# Patient Record
Sex: Female | Born: 1935 | Race: White | Hispanic: No | Marital: Married | Smoking: Never smoker
Health system: Southern US, Community
[De-identification: ages and names within clinical notes are randomized; demographics above are authoritative.]

---

## 2011-04-04 DIAGNOSIS — H10029 Other mucopurulent conjunctivitis, unspecified eye: Secondary | ICD-10-CM | POA: Diagnosis not present

## 2011-06-06 DIAGNOSIS — R002 Palpitations: Secondary | ICD-10-CM | POA: Diagnosis not present

## 2011-06-06 DIAGNOSIS — L820 Inflamed seborrheic keratosis: Secondary | ICD-10-CM | POA: Diagnosis not present

## 2011-06-06 DIAGNOSIS — E119 Type 2 diabetes mellitus without complications: Secondary | ICD-10-CM | POA: Diagnosis not present

## 2011-06-06 DIAGNOSIS — E0590 Thyrotoxicosis, unspecified without thyrotoxic crisis or storm: Secondary | ICD-10-CM | POA: Diagnosis not present

## 2011-06-06 DIAGNOSIS — E7800 Pure hypercholesterolemia, unspecified: Secondary | ICD-10-CM | POA: Diagnosis not present

## 2011-06-10 DIAGNOSIS — R002 Palpitations: Secondary | ICD-10-CM | POA: Diagnosis not present

## 2011-06-10 DIAGNOSIS — E119 Type 2 diabetes mellitus without complications: Secondary | ICD-10-CM | POA: Diagnosis not present

## 2011-06-10 DIAGNOSIS — D509 Iron deficiency anemia, unspecified: Secondary | ICD-10-CM | POA: Diagnosis not present

## 2011-06-10 DIAGNOSIS — E7800 Pure hypercholesterolemia, unspecified: Secondary | ICD-10-CM | POA: Diagnosis not present

## 2011-10-09 DIAGNOSIS — Z124 Encounter for screening for malignant neoplasm of cervix: Secondary | ICD-10-CM | POA: Diagnosis not present

## 2011-10-09 DIAGNOSIS — M949 Disorder of cartilage, unspecified: Secondary | ICD-10-CM | POA: Diagnosis not present

## 2011-10-09 DIAGNOSIS — I359 Nonrheumatic aortic valve disorder, unspecified: Secondary | ICD-10-CM | POA: Diagnosis not present

## 2011-10-09 DIAGNOSIS — I10 Essential (primary) hypertension: Secondary | ICD-10-CM | POA: Diagnosis not present

## 2011-10-09 DIAGNOSIS — D508 Other iron deficiency anemias: Secondary | ICD-10-CM | POA: Diagnosis not present

## 2011-10-09 DIAGNOSIS — M899 Disorder of bone, unspecified: Secondary | ICD-10-CM | POA: Diagnosis not present

## 2011-10-09 DIAGNOSIS — E119 Type 2 diabetes mellitus without complications: Secondary | ICD-10-CM | POA: Diagnosis not present

## 2011-10-09 DIAGNOSIS — E7800 Pure hypercholesterolemia, unspecified: Secondary | ICD-10-CM | POA: Diagnosis not present

## 2011-11-05 DIAGNOSIS — Z13820 Encounter for screening for osteoporosis: Secondary | ICD-10-CM | POA: Diagnosis not present

## 2011-11-05 DIAGNOSIS — Z1231 Encounter for screening mammogram for malignant neoplasm of breast: Secondary | ICD-10-CM | POA: Diagnosis not present

## 2011-11-05 DIAGNOSIS — M899 Disorder of bone, unspecified: Secondary | ICD-10-CM | POA: Diagnosis not present

## 2011-12-16 DIAGNOSIS — Z23 Encounter for immunization: Secondary | ICD-10-CM | POA: Diagnosis not present

## 2011-12-19 DIAGNOSIS — I10 Essential (primary) hypertension: Secondary | ICD-10-CM | POA: Diagnosis not present

## 2011-12-19 DIAGNOSIS — I2510 Atherosclerotic heart disease of native coronary artery without angina pectoris: Secondary | ICD-10-CM | POA: Diagnosis not present

## 2011-12-19 DIAGNOSIS — R609 Edema, unspecified: Secondary | ICD-10-CM | POA: Diagnosis not present

## 2011-12-25 DIAGNOSIS — R609 Edema, unspecified: Secondary | ICD-10-CM | POA: Diagnosis not present

## 2012-01-08 DIAGNOSIS — R011 Cardiac murmur, unspecified: Secondary | ICD-10-CM | POA: Diagnosis not present

## 2012-01-08 DIAGNOSIS — I2510 Atherosclerotic heart disease of native coronary artery without angina pectoris: Secondary | ICD-10-CM | POA: Diagnosis not present

## 2012-01-22 DIAGNOSIS — L570 Actinic keratosis: Secondary | ICD-10-CM | POA: Diagnosis not present

## 2012-01-22 DIAGNOSIS — L723 Sebaceous cyst: Secondary | ICD-10-CM | POA: Diagnosis not present

## 2012-01-27 DIAGNOSIS — Z961 Presence of intraocular lens: Secondary | ICD-10-CM | POA: Diagnosis not present

## 2012-02-24 DIAGNOSIS — I10 Essential (primary) hypertension: Secondary | ICD-10-CM | POA: Diagnosis not present

## 2012-02-24 DIAGNOSIS — N3941 Urge incontinence: Secondary | ICD-10-CM | POA: Diagnosis not present

## 2012-02-24 DIAGNOSIS — J309 Allergic rhinitis, unspecified: Secondary | ICD-10-CM | POA: Diagnosis not present

## 2012-02-24 DIAGNOSIS — M949 Disorder of cartilage, unspecified: Secondary | ICD-10-CM | POA: Diagnosis not present

## 2012-02-24 DIAGNOSIS — Z87440 Personal history of urinary (tract) infections: Secondary | ICD-10-CM | POA: Diagnosis not present

## 2012-02-24 DIAGNOSIS — I359 Nonrheumatic aortic valve disorder, unspecified: Secondary | ICD-10-CM | POA: Diagnosis not present

## 2012-02-24 DIAGNOSIS — M899 Disorder of bone, unspecified: Secondary | ICD-10-CM | POA: Diagnosis not present

## 2012-02-24 DIAGNOSIS — G4700 Insomnia, unspecified: Secondary | ICD-10-CM | POA: Diagnosis not present

## 2012-02-24 DIAGNOSIS — L259 Unspecified contact dermatitis, unspecified cause: Secondary | ICD-10-CM | POA: Diagnosis not present

## 2012-02-24 DIAGNOSIS — R82998 Other abnormal findings in urine: Secondary | ICD-10-CM | POA: Diagnosis not present

## 2012-02-24 DIAGNOSIS — E7800 Pure hypercholesterolemia, unspecified: Secondary | ICD-10-CM | POA: Diagnosis not present

## 2012-02-24 DIAGNOSIS — E119 Type 2 diabetes mellitus without complications: Secondary | ICD-10-CM | POA: Diagnosis not present

## 2012-02-24 DIAGNOSIS — R609 Edema, unspecified: Secondary | ICD-10-CM | POA: Diagnosis not present

## 2012-06-21 DIAGNOSIS — D509 Iron deficiency anemia, unspecified: Secondary | ICD-10-CM | POA: Diagnosis not present

## 2012-06-21 DIAGNOSIS — N3941 Urge incontinence: Secondary | ICD-10-CM | POA: Diagnosis not present

## 2012-06-21 DIAGNOSIS — E119 Type 2 diabetes mellitus without complications: Secondary | ICD-10-CM | POA: Diagnosis not present

## 2012-06-21 DIAGNOSIS — E0590 Thyrotoxicosis, unspecified without thyrotoxic crisis or storm: Secondary | ICD-10-CM | POA: Diagnosis not present

## 2012-06-21 DIAGNOSIS — E039 Hypothyroidism, unspecified: Secondary | ICD-10-CM | POA: Diagnosis not present

## 2012-06-21 DIAGNOSIS — E7800 Pure hypercholesterolemia, unspecified: Secondary | ICD-10-CM | POA: Diagnosis not present

## 2012-06-21 DIAGNOSIS — K219 Gastro-esophageal reflux disease without esophagitis: Secondary | ICD-10-CM | POA: Diagnosis not present

## 2012-06-21 DIAGNOSIS — R002 Palpitations: Secondary | ICD-10-CM | POA: Diagnosis not present

## 2012-06-21 DIAGNOSIS — I10 Essential (primary) hypertension: Secondary | ICD-10-CM | POA: Diagnosis not present

## 2012-06-21 DIAGNOSIS — F411 Generalized anxiety disorder: Secondary | ICD-10-CM | POA: Diagnosis not present

## 2012-10-26 DIAGNOSIS — E119 Type 2 diabetes mellitus without complications: Secondary | ICD-10-CM | POA: Diagnosis not present

## 2012-10-26 DIAGNOSIS — I10 Essential (primary) hypertension: Secondary | ICD-10-CM | POA: Diagnosis not present

## 2012-10-26 DIAGNOSIS — F411 Generalized anxiety disorder: Secondary | ICD-10-CM | POA: Diagnosis not present

## 2012-10-26 DIAGNOSIS — K219 Gastro-esophageal reflux disease without esophagitis: Secondary | ICD-10-CM | POA: Diagnosis not present

## 2012-10-26 DIAGNOSIS — M949 Disorder of cartilage, unspecified: Secondary | ICD-10-CM | POA: Diagnosis not present

## 2012-10-26 DIAGNOSIS — E039 Hypothyroidism, unspecified: Secondary | ICD-10-CM | POA: Diagnosis not present

## 2012-10-26 DIAGNOSIS — Z124 Encounter for screening for malignant neoplasm of cervix: Secondary | ICD-10-CM | POA: Diagnosis not present

## 2012-10-26 DIAGNOSIS — E7800 Pure hypercholesterolemia, unspecified: Secondary | ICD-10-CM | POA: Diagnosis not present

## 2012-10-26 DIAGNOSIS — E0590 Thyrotoxicosis, unspecified without thyrotoxic crisis or storm: Secondary | ICD-10-CM | POA: Diagnosis not present

## 2012-10-26 DIAGNOSIS — M899 Disorder of bone, unspecified: Secondary | ICD-10-CM | POA: Diagnosis not present

## 2012-10-26 DIAGNOSIS — R002 Palpitations: Secondary | ICD-10-CM | POA: Diagnosis not present

## 2012-10-26 DIAGNOSIS — R82998 Other abnormal findings in urine: Secondary | ICD-10-CM | POA: Diagnosis not present

## 2012-12-17 DIAGNOSIS — Z1231 Encounter for screening mammogram for malignant neoplasm of breast: Secondary | ICD-10-CM | POA: Diagnosis not present

## 2012-12-30 DIAGNOSIS — Z23 Encounter for immunization: Secondary | ICD-10-CM | POA: Diagnosis not present

## 2013-03-03 DIAGNOSIS — H26499 Other secondary cataract, unspecified eye: Secondary | ICD-10-CM | POA: Diagnosis not present

## 2013-03-04 DIAGNOSIS — I10 Essential (primary) hypertension: Secondary | ICD-10-CM | POA: Diagnosis not present

## 2013-03-04 DIAGNOSIS — N183 Chronic kidney disease, stage 3 unspecified: Secondary | ICD-10-CM | POA: Diagnosis not present

## 2013-03-04 DIAGNOSIS — Z87440 Personal history of urinary (tract) infections: Secondary | ICD-10-CM | POA: Diagnosis not present

## 2013-03-04 DIAGNOSIS — E7800 Pure hypercholesterolemia, unspecified: Secondary | ICD-10-CM | POA: Diagnosis not present

## 2013-03-04 DIAGNOSIS — I359 Nonrheumatic aortic valve disorder, unspecified: Secondary | ICD-10-CM | POA: Diagnosis not present

## 2013-03-04 DIAGNOSIS — M899 Disorder of bone, unspecified: Secondary | ICD-10-CM | POA: Diagnosis not present

## 2013-03-04 DIAGNOSIS — L259 Unspecified contact dermatitis, unspecified cause: Secondary | ICD-10-CM | POA: Diagnosis not present

## 2013-03-04 DIAGNOSIS — Z951 Presence of aortocoronary bypass graft: Secondary | ICD-10-CM | POA: Diagnosis not present

## 2013-03-04 DIAGNOSIS — E0590 Thyrotoxicosis, unspecified without thyrotoxic crisis or storm: Secondary | ICD-10-CM | POA: Diagnosis not present

## 2013-03-04 DIAGNOSIS — E1129 Type 2 diabetes mellitus with other diabetic kidney complication: Secondary | ICD-10-CM | POA: Diagnosis not present

## 2013-03-04 DIAGNOSIS — I2510 Atherosclerotic heart disease of native coronary artery without angina pectoris: Secondary | ICD-10-CM | POA: Diagnosis not present

## 2013-03-04 DIAGNOSIS — R634 Abnormal weight loss: Secondary | ICD-10-CM | POA: Diagnosis not present

## 2013-03-04 DIAGNOSIS — R630 Anorexia: Secondary | ICD-10-CM | POA: Diagnosis not present

## 2013-03-04 DIAGNOSIS — R609 Edema, unspecified: Secondary | ICD-10-CM | POA: Diagnosis not present

## 2013-05-09 DIAGNOSIS — Z9849 Cataract extraction status, unspecified eye: Secondary | ICD-10-CM | POA: Diagnosis not present

## 2013-05-09 DIAGNOSIS — H353190 Nonexudative age-related macular degeneration, unspecified eye, stage unspecified: Secondary | ICD-10-CM | POA: Diagnosis not present

## 2013-05-09 DIAGNOSIS — H01009 Unspecified blepharitis unspecified eye, unspecified eyelid: Secondary | ICD-10-CM | POA: Diagnosis not present

## 2013-05-09 DIAGNOSIS — H278 Other specified disorders of lens: Secondary | ICD-10-CM | POA: Diagnosis not present

## 2013-05-09 DIAGNOSIS — H526 Other disorders of refraction: Secondary | ICD-10-CM | POA: Diagnosis not present

## 2013-06-14 DIAGNOSIS — Z9849 Cataract extraction status, unspecified eye: Secondary | ICD-10-CM | POA: Diagnosis not present

## 2013-06-14 DIAGNOSIS — H353190 Nonexudative age-related macular degeneration, unspecified eye, stage unspecified: Secondary | ICD-10-CM | POA: Diagnosis not present

## 2013-06-14 DIAGNOSIS — H526 Other disorders of refraction: Secondary | ICD-10-CM | POA: Diagnosis not present

## 2013-06-14 DIAGNOSIS — H01009 Unspecified blepharitis unspecified eye, unspecified eyelid: Secondary | ICD-10-CM | POA: Diagnosis not present

## 2013-06-14 DIAGNOSIS — H26499 Other secondary cataract, unspecified eye: Secondary | ICD-10-CM | POA: Diagnosis not present

## 2013-06-22 DIAGNOSIS — I359 Nonrheumatic aortic valve disorder, unspecified: Secondary | ICD-10-CM | POA: Diagnosis not present

## 2013-06-22 DIAGNOSIS — I10 Essential (primary) hypertension: Secondary | ICD-10-CM | POA: Diagnosis not present

## 2013-06-22 DIAGNOSIS — I2510 Atherosclerotic heart disease of native coronary artery without angina pectoris: Secondary | ICD-10-CM | POA: Diagnosis not present

## 2013-06-22 DIAGNOSIS — E1129 Type 2 diabetes mellitus with other diabetic kidney complication: Secondary | ICD-10-CM | POA: Diagnosis not present

## 2013-06-22 DIAGNOSIS — N183 Chronic kidney disease, stage 3 unspecified: Secondary | ICD-10-CM | POA: Diagnosis not present

## 2013-06-22 DIAGNOSIS — Z862 Personal history of diseases of the blood and blood-forming organs and certain disorders involving the immune mechanism: Secondary | ICD-10-CM | POA: Diagnosis not present

## 2013-06-22 DIAGNOSIS — R609 Edema, unspecified: Secondary | ICD-10-CM | POA: Diagnosis not present

## 2013-06-22 DIAGNOSIS — L259 Unspecified contact dermatitis, unspecified cause: Secondary | ICD-10-CM | POA: Diagnosis not present

## 2013-06-22 DIAGNOSIS — Z951 Presence of aortocoronary bypass graft: Secondary | ICD-10-CM | POA: Diagnosis not present

## 2013-06-22 DIAGNOSIS — Z87440 Personal history of urinary (tract) infections: Secondary | ICD-10-CM | POA: Diagnosis not present

## 2013-06-22 DIAGNOSIS — N390 Urinary tract infection, site not specified: Secondary | ICD-10-CM | POA: Diagnosis not present

## 2013-10-24 ENCOUNTER — Other Ambulatory Visit: Payer: Self-pay | Admitting: Unknown Physician Specialty

## 2013-10-24 ENCOUNTER — Ambulatory Visit (INDEPENDENT_AMBULATORY_CARE_PROVIDER_SITE_OTHER): Payer: Medicare Other

## 2013-10-24 DIAGNOSIS — M899 Disorder of bone, unspecified: Secondary | ICD-10-CM | POA: Diagnosis not present

## 2013-10-24 DIAGNOSIS — R634 Abnormal weight loss: Secondary | ICD-10-CM | POA: Diagnosis not present

## 2013-10-24 DIAGNOSIS — K219 Gastro-esophageal reflux disease without esophagitis: Secondary | ICD-10-CM | POA: Diagnosis not present

## 2013-10-24 DIAGNOSIS — S92309A Fracture of unspecified metatarsal bone(s), unspecified foot, initial encounter for closed fracture: Secondary | ICD-10-CM | POA: Diagnosis not present

## 2013-10-24 DIAGNOSIS — N183 Chronic kidney disease, stage 3 unspecified: Secondary | ICD-10-CM | POA: Diagnosis not present

## 2013-10-24 DIAGNOSIS — X58XXXA Exposure to other specified factors, initial encounter: Secondary | ICD-10-CM

## 2013-10-24 DIAGNOSIS — L259 Unspecified contact dermatitis, unspecified cause: Secondary | ICD-10-CM | POA: Diagnosis not present

## 2013-10-24 DIAGNOSIS — I359 Nonrheumatic aortic valve disorder, unspecified: Secondary | ICD-10-CM | POA: Diagnosis not present

## 2013-10-24 DIAGNOSIS — E7800 Pure hypercholesterolemia, unspecified: Secondary | ICD-10-CM | POA: Diagnosis not present

## 2013-10-24 DIAGNOSIS — R52 Pain, unspecified: Secondary | ICD-10-CM

## 2013-10-24 DIAGNOSIS — M949 Disorder of cartilage, unspecified: Secondary | ICD-10-CM | POA: Diagnosis not present

## 2013-10-24 DIAGNOSIS — I10 Essential (primary) hypertension: Secondary | ICD-10-CM | POA: Diagnosis not present

## 2013-10-24 DIAGNOSIS — E0590 Thyrotoxicosis, unspecified without thyrotoxic crisis or storm: Secondary | ICD-10-CM | POA: Diagnosis not present

## 2013-10-24 DIAGNOSIS — E1129 Type 2 diabetes mellitus with other diabetic kidney complication: Secondary | ICD-10-CM | POA: Diagnosis not present

## 2013-10-24 DIAGNOSIS — I872 Venous insufficiency (chronic) (peripheral): Secondary | ICD-10-CM | POA: Diagnosis not present

## 2013-10-24 DIAGNOSIS — Z862 Personal history of diseases of the blood and blood-forming organs and certain disorders involving the immune mechanism: Secondary | ICD-10-CM | POA: Diagnosis not present

## 2013-10-24 DIAGNOSIS — Z124 Encounter for screening for malignant neoplasm of cervix: Secondary | ICD-10-CM | POA: Diagnosis not present

## 2013-10-24 DIAGNOSIS — I2510 Atherosclerotic heart disease of native coronary artery without angina pectoris: Secondary | ICD-10-CM | POA: Diagnosis not present

## 2013-10-24 DIAGNOSIS — Z951 Presence of aortocoronary bypass graft: Secondary | ICD-10-CM | POA: Diagnosis not present

## 2013-10-25 ENCOUNTER — Encounter: Payer: Self-pay | Admitting: Sports Medicine

## 2013-10-25 ENCOUNTER — Ambulatory Visit (INDEPENDENT_AMBULATORY_CARE_PROVIDER_SITE_OTHER): Payer: Medicare Other | Admitting: Sports Medicine

## 2013-10-25 VITALS — BP 142/60 | HR 89 | Ht <= 58 in | Wt 107.0 lb

## 2013-10-25 DIAGNOSIS — S92351A Displaced fracture of fifth metatarsal bone, right foot, initial encounter for closed fracture: Secondary | ICD-10-CM | POA: Insufficient documentation

## 2013-10-25 DIAGNOSIS — S92309A Fracture of unspecified metatarsal bone(s), unspecified foot, initial encounter for closed fracture: Secondary | ICD-10-CM

## 2013-10-25 NOTE — Assessment & Plan Note (Addendum)
CAM boot Strap with compressive dressing. Return in 3 weeks, x-ray before visit. Low-dose hydrocodone for pain, prescription also written for a walker.  I billed a fracture code for this encounter, all subsequent visits will be post-op checks in the global period.

## 2013-10-25 NOTE — Progress Notes (Signed)
   Subjective:    I'm seeing this patient as a consultation for:  Dr. Harl Bowieathy Judge  CC:  Fracture  HPI: This is a very pleasant 80 year old female, recently she inverted her right foot, had immediate pain, swelling, bruising, she saw Dr. Sharee PimpleJudge were x-ray showed a fracture, transverse to the base of the fifth metatarsal. She was referred to me for further evaluation and treatment. Pain is mild, improving.  Past medical history, Surgical history, Family history not pertinant except as noted below, Social history, Allergies, and medications have been entered into the medical record, reviewed, and no changes needed.   Review of Systems: No headache, visual changes, nausea, vomiting, diarrhea, constipation, dizziness, abdominal pain, skin rash, fevers, chills, night sweats, weight loss, swollen lymph nodes, body aches, joint swelling, muscle aches, chest pain, shortness of breath, mood changes, visual or auditory hallucinations.   Objective:   General: Well Developed, well nourished, and in no acute distress.  Neuro/Psych: Alert and oriented x3, extra-ocular muscles intact, able to move all 4 extremities, sensation grossly intact. Skin: Warm and dry, no rashes noted.  Respiratory: Not using accessory muscles, speaking in full sentences, trachea midline.  Cardiovascular: Pulses palpable, no extremity edema. Abdomen: Does not appear distended. Right foot: Tender to palpation at the base of fifth metatarsal with swelling and bruising. Neurovascularly intact distally.  X-rays show a transverse fracture nondisplaced through the base of the fifth metatarsal.  Impression and Recommendations:   This case required medical decision making of moderate complexity.

## 2013-10-26 ENCOUNTER — Telehealth: Payer: Self-pay

## 2013-10-26 ENCOUNTER — Telehealth: Payer: Self-pay | Admitting: Sports Medicine

## 2013-10-26 MED ORDER — AMBULATORY NON FORMULARY MEDICATION: Status: AC

## 2013-10-26 MED ORDER — HYDROCODONE-ACETAMINOPHEN 5-325 MG PO TABS: ORAL_TABLET | ORAL | Status: DC

## 2013-10-26 NOTE — Telephone Encounter (Signed)
Vanessa Galloway called and stated that she is in pain and would like to see if she could get something for pain and also would like to see if she could also get a walker./ Alva GarnetStacy Jayvion Stefanski,CMA

## 2013-10-26 NOTE — Addendum Note (Signed)
Addended by: Monica BectonHEKKEKANDAM, Nataly Pacifico J on: 10/26/2013 01:45 PM   Modules accepted: Orders

## 2013-10-26 NOTE — Telephone Encounter (Signed)
Taken care of already

## 2013-11-15 ENCOUNTER — Encounter: Payer: Self-pay | Admitting: Sports Medicine

## 2013-11-15 ENCOUNTER — Ambulatory Visit: Payer: Medicare Other | Admitting: Sports Medicine

## 2013-11-15 ENCOUNTER — Ambulatory Visit (INDEPENDENT_AMBULATORY_CARE_PROVIDER_SITE_OTHER): Payer: Medicare Other

## 2013-11-15 VITALS — BP 134/54 | HR 78 | Ht 60.0 in | Wt 110.0 lb

## 2013-11-15 DIAGNOSIS — S92351A Displaced fracture of fifth metatarsal bone, right foot, initial encounter for closed fracture: Secondary | ICD-10-CM

## 2013-11-15 DIAGNOSIS — IMO0001 Reserved for inherently not codable concepts without codable children: Secondary | ICD-10-CM | POA: Diagnosis not present

## 2013-11-15 DIAGNOSIS — S92309A Fracture of unspecified metatarsal bone(s), unspecified foot, initial encounter for closed fracture: Secondary | ICD-10-CM | POA: Diagnosis not present

## 2013-11-15 DIAGNOSIS — S92351D Displaced fracture of fifth metatarsal bone, right foot, subsequent encounter for fracture with routine healing: Secondary | ICD-10-CM

## 2013-11-15 NOTE — Assessment & Plan Note (Signed)
Clinically resolved, return as needed. 

## 2013-11-15 NOTE — Progress Notes (Signed)
  Subjective: 4 weeks post right fifth metatarsal base fracture, pain-free.   Objective: General: Well-developed, well-nourished, and in no acute distress. Right Foot: Bilateral lower extremity edema, nontender of the fracture. Range of motion is full in all directions. Strength is 5/5 in all directions. No hallux valgus. No pes cavus or pes planus. No abnormal callus noted. No pain over the navicular prominence, or base of fifth metatarsal. No tenderness to palpation of the calcaneal insertion of plantar fascia. No pain at the Achilles insertion. No pain over the calcaneal bursa. No pain of the retrocalcaneal bursa. No tenderness to palpation over the tarsals, metatarsals, or phalanges. No hallux rigidus or limitus. No tenderness palpation over interphalangeal joints. No pain with compression of the metatarsal heads. Neurovascularly intact distally. X-ray showed continued blurring of the fracture lines suggestive of healing.    Assessment/plan:

## 2013-12-06 DIAGNOSIS — M949 Disorder of cartilage, unspecified: Secondary | ICD-10-CM | POA: Diagnosis not present

## 2013-12-06 DIAGNOSIS — M899 Disorder of bone, unspecified: Secondary | ICD-10-CM | POA: Diagnosis not present

## 2013-12-15 DIAGNOSIS — L259 Unspecified contact dermatitis, unspecified cause: Secondary | ICD-10-CM | POA: Diagnosis not present

## 2013-12-15 DIAGNOSIS — Z124 Encounter for screening for malignant neoplasm of cervix: Secondary | ICD-10-CM | POA: Diagnosis not present

## 2013-12-15 DIAGNOSIS — I2510 Atherosclerotic heart disease of native coronary artery without angina pectoris: Secondary | ICD-10-CM | POA: Diagnosis not present

## 2013-12-15 DIAGNOSIS — Z23 Encounter for immunization: Secondary | ICD-10-CM | POA: Diagnosis not present

## 2013-12-15 DIAGNOSIS — Z951 Presence of aortocoronary bypass graft: Secondary | ICD-10-CM | POA: Diagnosis not present

## 2013-12-15 DIAGNOSIS — I359 Nonrheumatic aortic valve disorder, unspecified: Secondary | ICD-10-CM | POA: Diagnosis not present

## 2013-12-15 DIAGNOSIS — E1129 Type 2 diabetes mellitus with other diabetic kidney complication: Secondary | ICD-10-CM | POA: Diagnosis not present

## 2013-12-15 DIAGNOSIS — N183 Chronic kidney disease, stage 3 unspecified: Secondary | ICD-10-CM | POA: Diagnosis not present

## 2014-03-02 DIAGNOSIS — E780 Pure hypercholesterolemia: Secondary | ICD-10-CM | POA: Diagnosis not present

## 2014-03-02 DIAGNOSIS — I10 Essential (primary) hypertension: Secondary | ICD-10-CM | POA: Diagnosis not present

## 2014-03-02 DIAGNOSIS — E1122 Type 2 diabetes mellitus with diabetic chronic kidney disease: Secondary | ICD-10-CM | POA: Diagnosis not present

## 2014-03-02 DIAGNOSIS — K219 Gastro-esophageal reflux disease without esophagitis: Secondary | ICD-10-CM | POA: Diagnosis not present

## 2014-03-02 DIAGNOSIS — Z862 Personal history of diseases of the blood and blood-forming organs and certain disorders involving the immune mechanism: Secondary | ICD-10-CM | POA: Diagnosis not present

## 2014-03-02 DIAGNOSIS — N183 Chronic kidney disease, stage 3 (moderate): Secondary | ICD-10-CM | POA: Diagnosis not present

## 2014-03-08 DIAGNOSIS — Z1231 Encounter for screening mammogram for malignant neoplasm of breast: Secondary | ICD-10-CM | POA: Diagnosis not present

## 2014-07-03 NOTE — Telephone Encounter (Signed)
close

## 2014-07-05 DIAGNOSIS — N183 Chronic kidney disease, stage 3 (moderate): Secondary | ICD-10-CM | POA: Diagnosis not present

## 2014-07-05 DIAGNOSIS — E1122 Type 2 diabetes mellitus with diabetic chronic kidney disease: Secondary | ICD-10-CM | POA: Diagnosis not present

## 2014-07-05 DIAGNOSIS — D509 Iron deficiency anemia, unspecified: Secondary | ICD-10-CM | POA: Diagnosis not present

## 2014-07-05 DIAGNOSIS — K219 Gastro-esophageal reflux disease without esophagitis: Secondary | ICD-10-CM | POA: Diagnosis not present

## 2014-07-05 DIAGNOSIS — I10 Essential (primary) hypertension: Secondary | ICD-10-CM | POA: Diagnosis not present

## 2014-07-05 DIAGNOSIS — E780 Pure hypercholesterolemia: Secondary | ICD-10-CM | POA: Diagnosis not present

## 2014-07-21 DIAGNOSIS — H20011 Primary iridocyclitis, right eye: Secondary | ICD-10-CM | POA: Diagnosis not present

## 2014-07-27 DIAGNOSIS — H3531 Nonexudative age-related macular degeneration: Secondary | ICD-10-CM | POA: Diagnosis not present

## 2014-07-27 DIAGNOSIS — H527 Unspecified disorder of refraction: Secondary | ICD-10-CM | POA: Diagnosis not present

## 2014-07-27 DIAGNOSIS — H01001 Unspecified blepharitis right upper eyelid: Secondary | ICD-10-CM | POA: Diagnosis not present

## 2014-07-27 DIAGNOSIS — Z961 Presence of intraocular lens: Secondary | ICD-10-CM | POA: Diagnosis not present

## 2014-07-27 DIAGNOSIS — H26491 Other secondary cataract, right eye: Secondary | ICD-10-CM | POA: Diagnosis not present

## 2014-07-27 DIAGNOSIS — E119 Type 2 diabetes mellitus without complications: Secondary | ICD-10-CM | POA: Diagnosis not present

## 2014-07-27 DIAGNOSIS — H2101 Hyphema, right eye: Secondary | ICD-10-CM | POA: Diagnosis not present

## 2014-07-27 DIAGNOSIS — H4031X2 Glaucoma secondary to eye trauma, right eye, moderate stage: Secondary | ICD-10-CM | POA: Diagnosis not present

## 2014-07-28 DIAGNOSIS — H26491 Other secondary cataract, right eye: Secondary | ICD-10-CM | POA: Diagnosis not present

## 2014-07-28 DIAGNOSIS — H01001 Unspecified blepharitis right upper eyelid: Secondary | ICD-10-CM | POA: Diagnosis not present

## 2014-07-28 DIAGNOSIS — S0591XD Unspecified injury of right eye and orbit, subsequent encounter: Secondary | ICD-10-CM | POA: Diagnosis not present

## 2014-07-28 DIAGNOSIS — H527 Unspecified disorder of refraction: Secondary | ICD-10-CM | POA: Diagnosis not present

## 2014-07-28 DIAGNOSIS — E119 Type 2 diabetes mellitus without complications: Secondary | ICD-10-CM | POA: Diagnosis not present

## 2014-07-28 DIAGNOSIS — H2101 Hyphema, right eye: Secondary | ICD-10-CM | POA: Diagnosis not present

## 2014-07-28 DIAGNOSIS — H4031X2 Glaucoma secondary to eye trauma, right eye, moderate stage: Secondary | ICD-10-CM | POA: Diagnosis not present

## 2014-07-28 DIAGNOSIS — H3531 Nonexudative age-related macular degeneration: Secondary | ICD-10-CM | POA: Diagnosis not present

## 2014-07-28 DIAGNOSIS — Z961 Presence of intraocular lens: Secondary | ICD-10-CM | POA: Diagnosis not present

## 2014-07-29 DIAGNOSIS — H01001 Unspecified blepharitis right upper eyelid: Secondary | ICD-10-CM | POA: Diagnosis not present

## 2014-07-29 DIAGNOSIS — H527 Unspecified disorder of refraction: Secondary | ICD-10-CM | POA: Diagnosis not present

## 2014-07-29 DIAGNOSIS — H4031X2 Glaucoma secondary to eye trauma, right eye, moderate stage: Secondary | ICD-10-CM | POA: Diagnosis not present

## 2014-07-29 DIAGNOSIS — H2101 Hyphema, right eye: Secondary | ICD-10-CM | POA: Diagnosis not present

## 2014-07-29 DIAGNOSIS — H26491 Other secondary cataract, right eye: Secondary | ICD-10-CM | POA: Diagnosis not present

## 2014-07-29 DIAGNOSIS — Z961 Presence of intraocular lens: Secondary | ICD-10-CM | POA: Diagnosis not present

## 2014-07-29 DIAGNOSIS — H3531 Nonexudative age-related macular degeneration: Secondary | ICD-10-CM | POA: Diagnosis not present

## 2014-07-29 DIAGNOSIS — S0591XD Unspecified injury of right eye and orbit, subsequent encounter: Secondary | ICD-10-CM | POA: Diagnosis not present

## 2014-07-29 DIAGNOSIS — E119 Type 2 diabetes mellitus without complications: Secondary | ICD-10-CM | POA: Diagnosis not present

## 2014-08-04 DIAGNOSIS — Z961 Presence of intraocular lens: Secondary | ICD-10-CM | POA: Diagnosis not present

## 2014-08-04 DIAGNOSIS — H4031X2 Glaucoma secondary to eye trauma, right eye, moderate stage: Secondary | ICD-10-CM | POA: Diagnosis not present

## 2014-08-04 DIAGNOSIS — S0591XD Unspecified injury of right eye and orbit, subsequent encounter: Secondary | ICD-10-CM | POA: Diagnosis not present

## 2014-08-04 DIAGNOSIS — E119 Type 2 diabetes mellitus without complications: Secondary | ICD-10-CM | POA: Diagnosis not present

## 2014-08-04 DIAGNOSIS — H527 Unspecified disorder of refraction: Secondary | ICD-10-CM | POA: Diagnosis not present

## 2014-08-04 DIAGNOSIS — H01001 Unspecified blepharitis right upper eyelid: Secondary | ICD-10-CM | POA: Diagnosis not present

## 2014-08-04 DIAGNOSIS — H2101 Hyphema, right eye: Secondary | ICD-10-CM | POA: Diagnosis not present

## 2014-08-04 DIAGNOSIS — H26491 Other secondary cataract, right eye: Secondary | ICD-10-CM | POA: Diagnosis not present

## 2014-08-04 DIAGNOSIS — H3531 Nonexudative age-related macular degeneration: Secondary | ICD-10-CM | POA: Diagnosis not present

## 2014-08-10 DIAGNOSIS — H527 Unspecified disorder of refraction: Secondary | ICD-10-CM | POA: Diagnosis not present

## 2014-08-10 DIAGNOSIS — H26491 Other secondary cataract, right eye: Secondary | ICD-10-CM | POA: Diagnosis not present

## 2014-08-10 DIAGNOSIS — H01001 Unspecified blepharitis right upper eyelid: Secondary | ICD-10-CM | POA: Diagnosis not present

## 2014-08-10 DIAGNOSIS — E119 Type 2 diabetes mellitus without complications: Secondary | ICD-10-CM | POA: Diagnosis not present

## 2014-08-10 DIAGNOSIS — H3531 Nonexudative age-related macular degeneration: Secondary | ICD-10-CM | POA: Diagnosis not present

## 2014-08-10 DIAGNOSIS — Z961 Presence of intraocular lens: Secondary | ICD-10-CM | POA: Diagnosis not present

## 2014-08-10 DIAGNOSIS — H2101 Hyphema, right eye: Secondary | ICD-10-CM | POA: Diagnosis not present

## 2014-08-10 DIAGNOSIS — S0591XS Unspecified injury of right eye and orbit, sequela: Secondary | ICD-10-CM | POA: Diagnosis not present

## 2014-08-10 DIAGNOSIS — H4031X2 Glaucoma secondary to eye trauma, right eye, moderate stage: Secondary | ICD-10-CM | POA: Diagnosis not present

## 2014-08-24 DIAGNOSIS — S0591XS Unspecified injury of right eye and orbit, sequela: Secondary | ICD-10-CM | POA: Diagnosis not present

## 2014-08-24 DIAGNOSIS — H01001 Unspecified blepharitis right upper eyelid: Secondary | ICD-10-CM | POA: Diagnosis not present

## 2014-08-24 DIAGNOSIS — H4031X2 Glaucoma secondary to eye trauma, right eye, moderate stage: Secondary | ICD-10-CM | POA: Diagnosis not present

## 2014-08-24 DIAGNOSIS — E119 Type 2 diabetes mellitus without complications: Secondary | ICD-10-CM | POA: Diagnosis not present

## 2014-08-24 DIAGNOSIS — H26491 Other secondary cataract, right eye: Secondary | ICD-10-CM | POA: Diagnosis not present

## 2014-08-24 DIAGNOSIS — H527 Unspecified disorder of refraction: Secondary | ICD-10-CM | POA: Diagnosis not present

## 2014-08-24 DIAGNOSIS — Z961 Presence of intraocular lens: Secondary | ICD-10-CM | POA: Diagnosis not present

## 2014-08-24 DIAGNOSIS — H2101 Hyphema, right eye: Secondary | ICD-10-CM | POA: Diagnosis not present

## 2014-08-24 DIAGNOSIS — H3531 Nonexudative age-related macular degeneration: Secondary | ICD-10-CM | POA: Diagnosis not present

## 2014-09-08 DIAGNOSIS — H4031X2 Glaucoma secondary to eye trauma, right eye, moderate stage: Secondary | ICD-10-CM | POA: Diagnosis not present

## 2014-09-08 DIAGNOSIS — Z961 Presence of intraocular lens: Secondary | ICD-10-CM | POA: Diagnosis not present

## 2014-09-08 DIAGNOSIS — H2101 Hyphema, right eye: Secondary | ICD-10-CM | POA: Diagnosis not present

## 2014-09-08 DIAGNOSIS — H527 Unspecified disorder of refraction: Secondary | ICD-10-CM | POA: Diagnosis not present

## 2014-09-08 DIAGNOSIS — H26491 Other secondary cataract, right eye: Secondary | ICD-10-CM | POA: Diagnosis not present

## 2014-09-08 DIAGNOSIS — H3531 Nonexudative age-related macular degeneration: Secondary | ICD-10-CM | POA: Diagnosis not present

## 2014-09-08 DIAGNOSIS — E119 Type 2 diabetes mellitus without complications: Secondary | ICD-10-CM | POA: Diagnosis not present

## 2014-09-08 DIAGNOSIS — H01001 Unspecified blepharitis right upper eyelid: Secondary | ICD-10-CM | POA: Diagnosis not present

## 2014-09-08 DIAGNOSIS — S0591XS Unspecified injury of right eye and orbit, sequela: Secondary | ICD-10-CM | POA: Diagnosis not present

## 2014-10-19 DIAGNOSIS — H2101 Hyphema, right eye: Secondary | ICD-10-CM | POA: Diagnosis not present

## 2014-11-03 DIAGNOSIS — M8580 Other specified disorders of bone density and structure, unspecified site: Secondary | ICD-10-CM | POA: Diagnosis not present

## 2014-11-03 DIAGNOSIS — N183 Chronic kidney disease, stage 3 (moderate): Secondary | ICD-10-CM | POA: Diagnosis not present

## 2014-11-03 DIAGNOSIS — M899 Disorder of bone, unspecified: Secondary | ICD-10-CM | POA: Diagnosis not present

## 2014-11-03 DIAGNOSIS — Z1289 Encounter for screening for malignant neoplasm of other sites: Secondary | ICD-10-CM | POA: Diagnosis not present

## 2014-11-03 DIAGNOSIS — E1122 Type 2 diabetes mellitus with diabetic chronic kidney disease: Secondary | ICD-10-CM | POA: Diagnosis not present

## 2014-11-03 DIAGNOSIS — I10 Essential (primary) hypertension: Secondary | ICD-10-CM | POA: Diagnosis not present

## 2014-11-03 DIAGNOSIS — E780 Pure hypercholesterolemia: Secondary | ICD-10-CM | POA: Diagnosis not present

## 2014-11-03 DIAGNOSIS — Z23 Encounter for immunization: Secondary | ICD-10-CM | POA: Diagnosis not present

## 2014-11-03 DIAGNOSIS — E0590 Thyrotoxicosis, unspecified without thyrotoxic crisis or storm: Secondary | ICD-10-CM | POA: Diagnosis not present

## 2014-11-03 DIAGNOSIS — R828 Abnormal findings on cytological and histological examination of urine: Secondary | ICD-10-CM | POA: Diagnosis not present

## 2014-12-28 DIAGNOSIS — Z23 Encounter for immunization: Secondary | ICD-10-CM | POA: Diagnosis not present

## 2015-01-10 DIAGNOSIS — E119 Type 2 diabetes mellitus without complications: Secondary | ICD-10-CM | POA: Diagnosis not present

## 2015-03-08 DIAGNOSIS — K219 Gastro-esophageal reflux disease without esophagitis: Secondary | ICD-10-CM | POA: Diagnosis not present

## 2015-03-08 DIAGNOSIS — E039 Hypothyroidism, unspecified: Secondary | ICD-10-CM | POA: Diagnosis not present

## 2015-03-08 DIAGNOSIS — R634 Abnormal weight loss: Secondary | ICD-10-CM | POA: Diagnosis not present

## 2015-03-08 DIAGNOSIS — E0590 Thyrotoxicosis, unspecified without thyrotoxic crisis or storm: Secondary | ICD-10-CM | POA: Diagnosis not present

## 2015-03-08 DIAGNOSIS — E1059 Type 1 diabetes mellitus with other circulatory complications: Secondary | ICD-10-CM | POA: Diagnosis not present

## 2015-03-08 DIAGNOSIS — I10 Essential (primary) hypertension: Secondary | ICD-10-CM | POA: Diagnosis not present

## 2015-03-08 DIAGNOSIS — E7800 Pure hypercholesterolemia, unspecified: Secondary | ICD-10-CM | POA: Diagnosis not present

## 2015-03-08 DIAGNOSIS — D509 Iron deficiency anemia, unspecified: Secondary | ICD-10-CM | POA: Diagnosis not present

## 2015-03-08 DIAGNOSIS — E1122 Type 2 diabetes mellitus with diabetic chronic kidney disease: Secondary | ICD-10-CM | POA: Diagnosis not present

## 2015-05-24 IMAGING — CR DG FOOT COMPLETE 3+V*R*
3 series · 3 of 3 positions shown · non-contrast
Comparison: 10/24/2013.

CLINICAL DATA: Followup closed fracture of the fifth metatarsal of
the right foot.

EXAM:
RIGHT FOOT COMPLETE - 3+ VIEW

[view not recorded (1 of 3)]
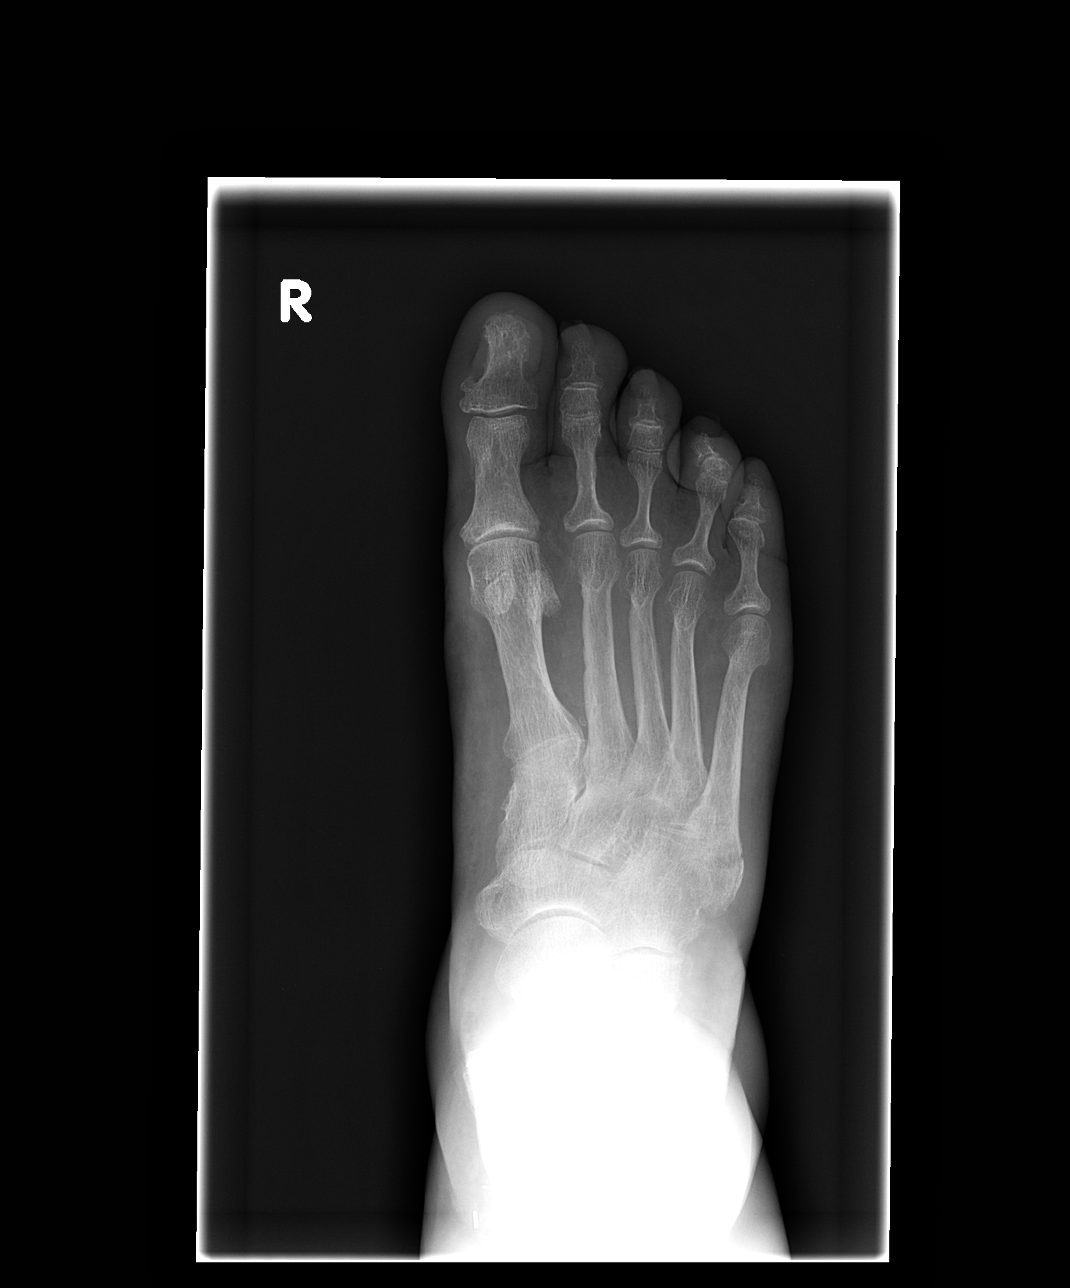

[view not recorded (2 of 3)]
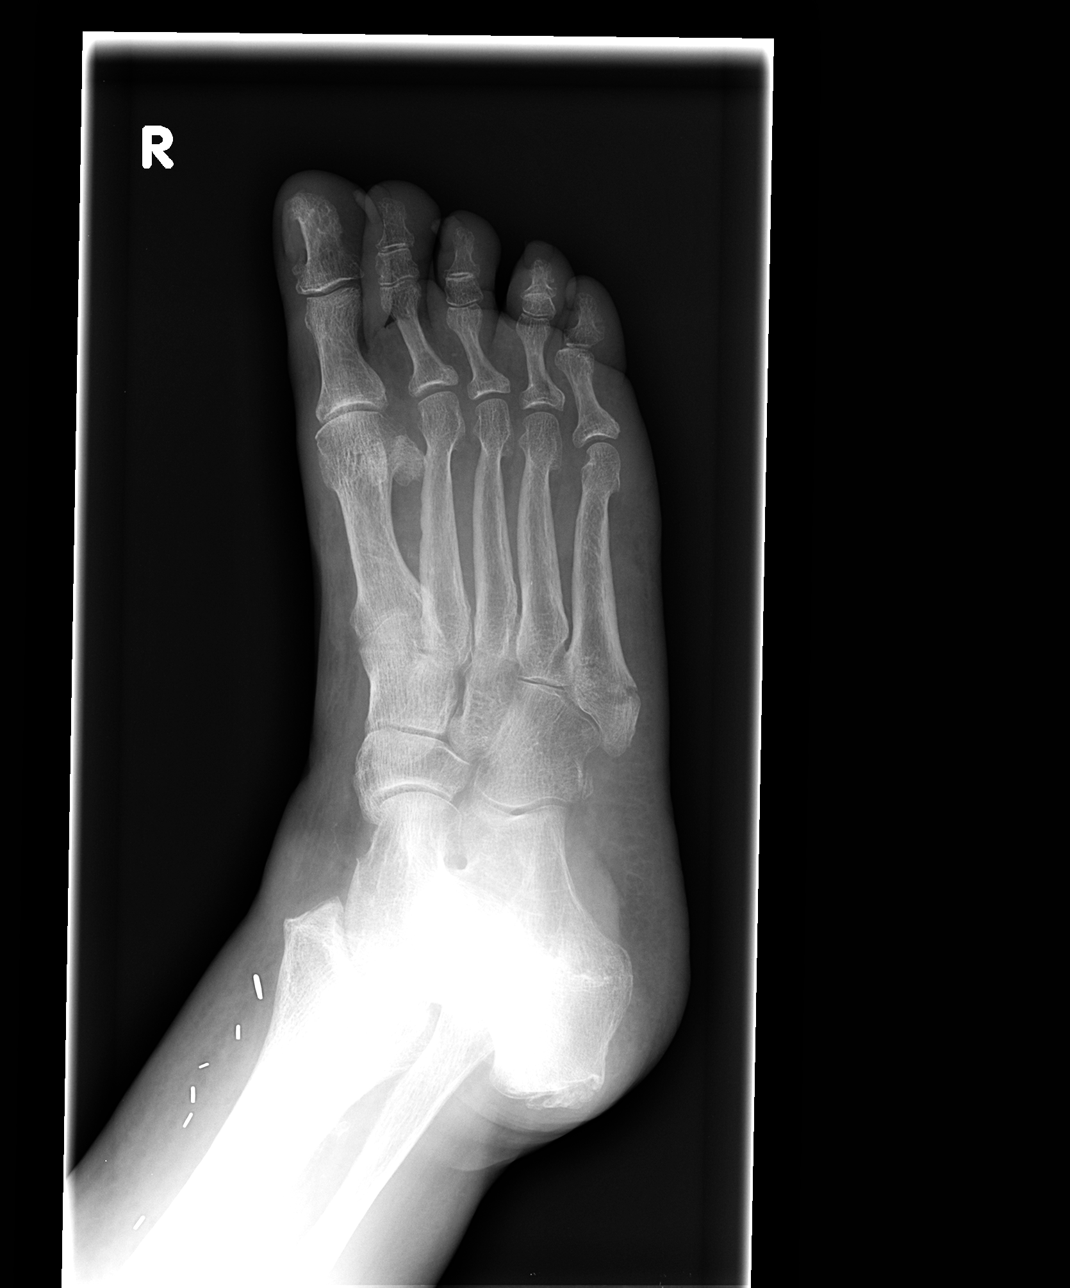

[view not recorded (3 of 3)]
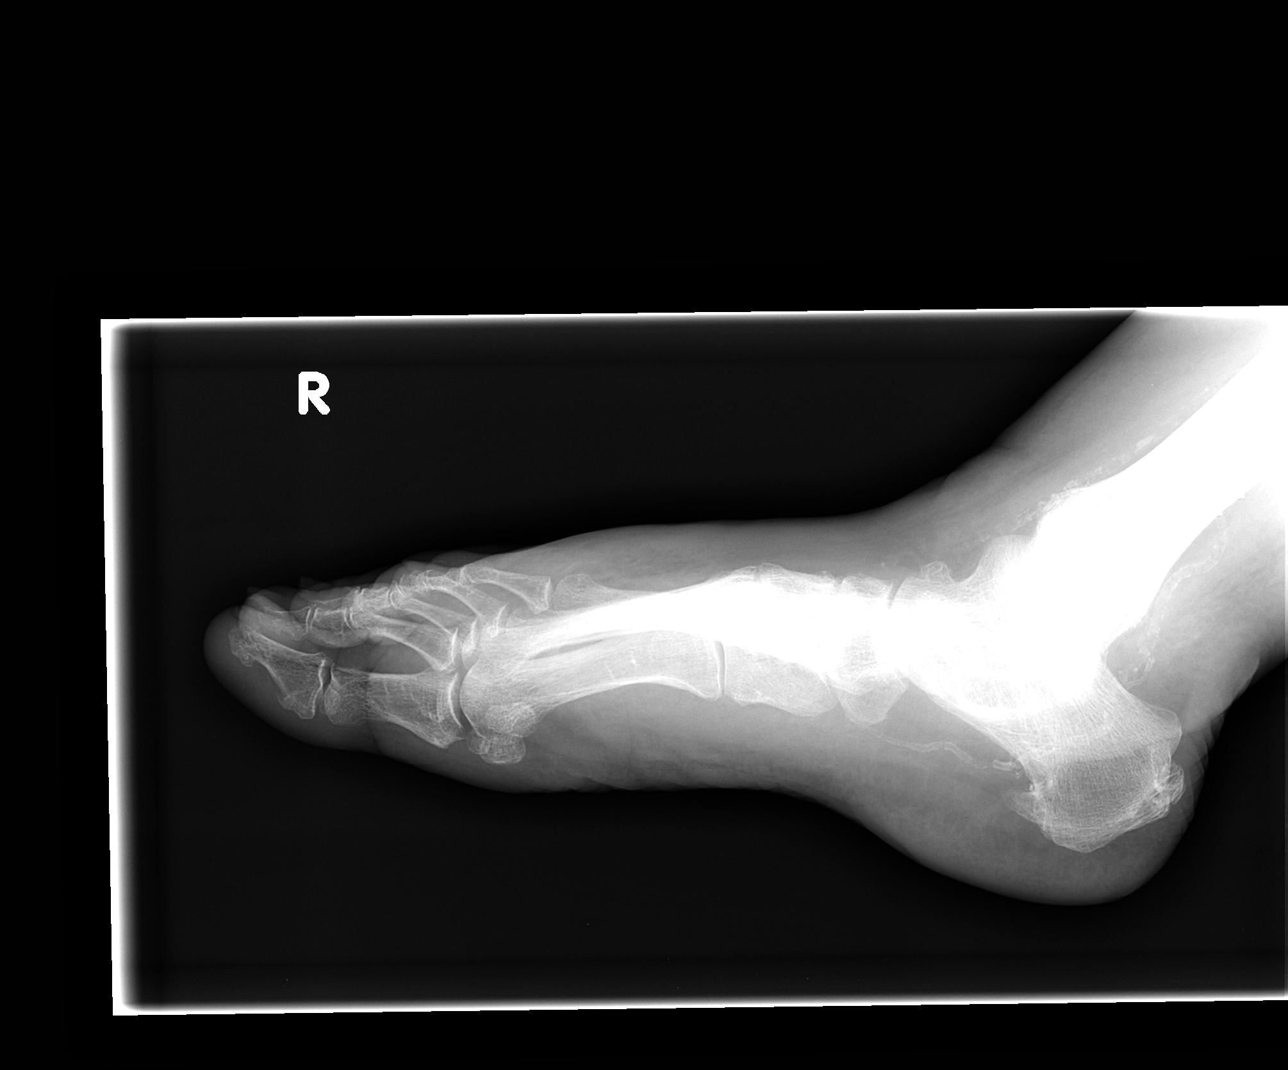

[3 of 3 positions shown; findings below may reference images not displayed]

FINDINGS: Nondisplaced fracture of the base of the fifth metatarsal is again
noted. The fracture line is wider and less well-defined consistent
with the resorptive phase of healing. No callus formation is seen.

There are no new fractures. There is no dislocation. Bones are
demineralized. There is persistent forefoot soft tissue swelling.
Prominent dorsal and plantar calcaneal spurs are evident. There are
vascular calcifications, also stable.
IMPRESSION: There has been mild interval healing with resorption along the
margins of the fracture of the base the fifth metatarsal, with the
fracture line becoming less well-defined.

## 2015-07-03 DIAGNOSIS — I10 Essential (primary) hypertension: Secondary | ICD-10-CM | POA: Diagnosis not present

## 2015-07-03 DIAGNOSIS — N183 Chronic kidney disease, stage 3 (moderate): Secondary | ICD-10-CM | POA: Diagnosis not present

## 2015-07-03 DIAGNOSIS — E1122 Type 2 diabetes mellitus with diabetic chronic kidney disease: Secondary | ICD-10-CM | POA: Diagnosis not present

## 2015-07-03 DIAGNOSIS — E7800 Pure hypercholesterolemia, unspecified: Secondary | ICD-10-CM | POA: Diagnosis not present

## 2015-07-18 DIAGNOSIS — R062 Wheezing: Secondary | ICD-10-CM | POA: Diagnosis not present

## 2015-07-18 DIAGNOSIS — N390 Urinary tract infection, site not specified: Secondary | ICD-10-CM | POA: Diagnosis not present

## 2017-12-22 DEATH — deceased
